# Patient Record
Sex: Male | Born: 1935 | Race: White | Hispanic: No
Health system: Southern US, Community
[De-identification: ages and names within clinical notes are randomized; demographics above are authoritative.]

---

## 2011-08-21 ENCOUNTER — Inpatient Hospital Stay: Payer: Self-pay | Admitting: Internal Medicine

## 2011-08-29 DEATH — deceased

## 2013-06-14 IMAGING — CR DG LUMBAR SPINE 2-3V
1 series · 3 of 3 positions shown · non-contrast
Comparison: none

REASON FOR EXAM: include sacram also. back pain
COMMENTS:

[Series 1: ap · 0.17mm/px · 3 of 3 slices shown]
[im 1/3]
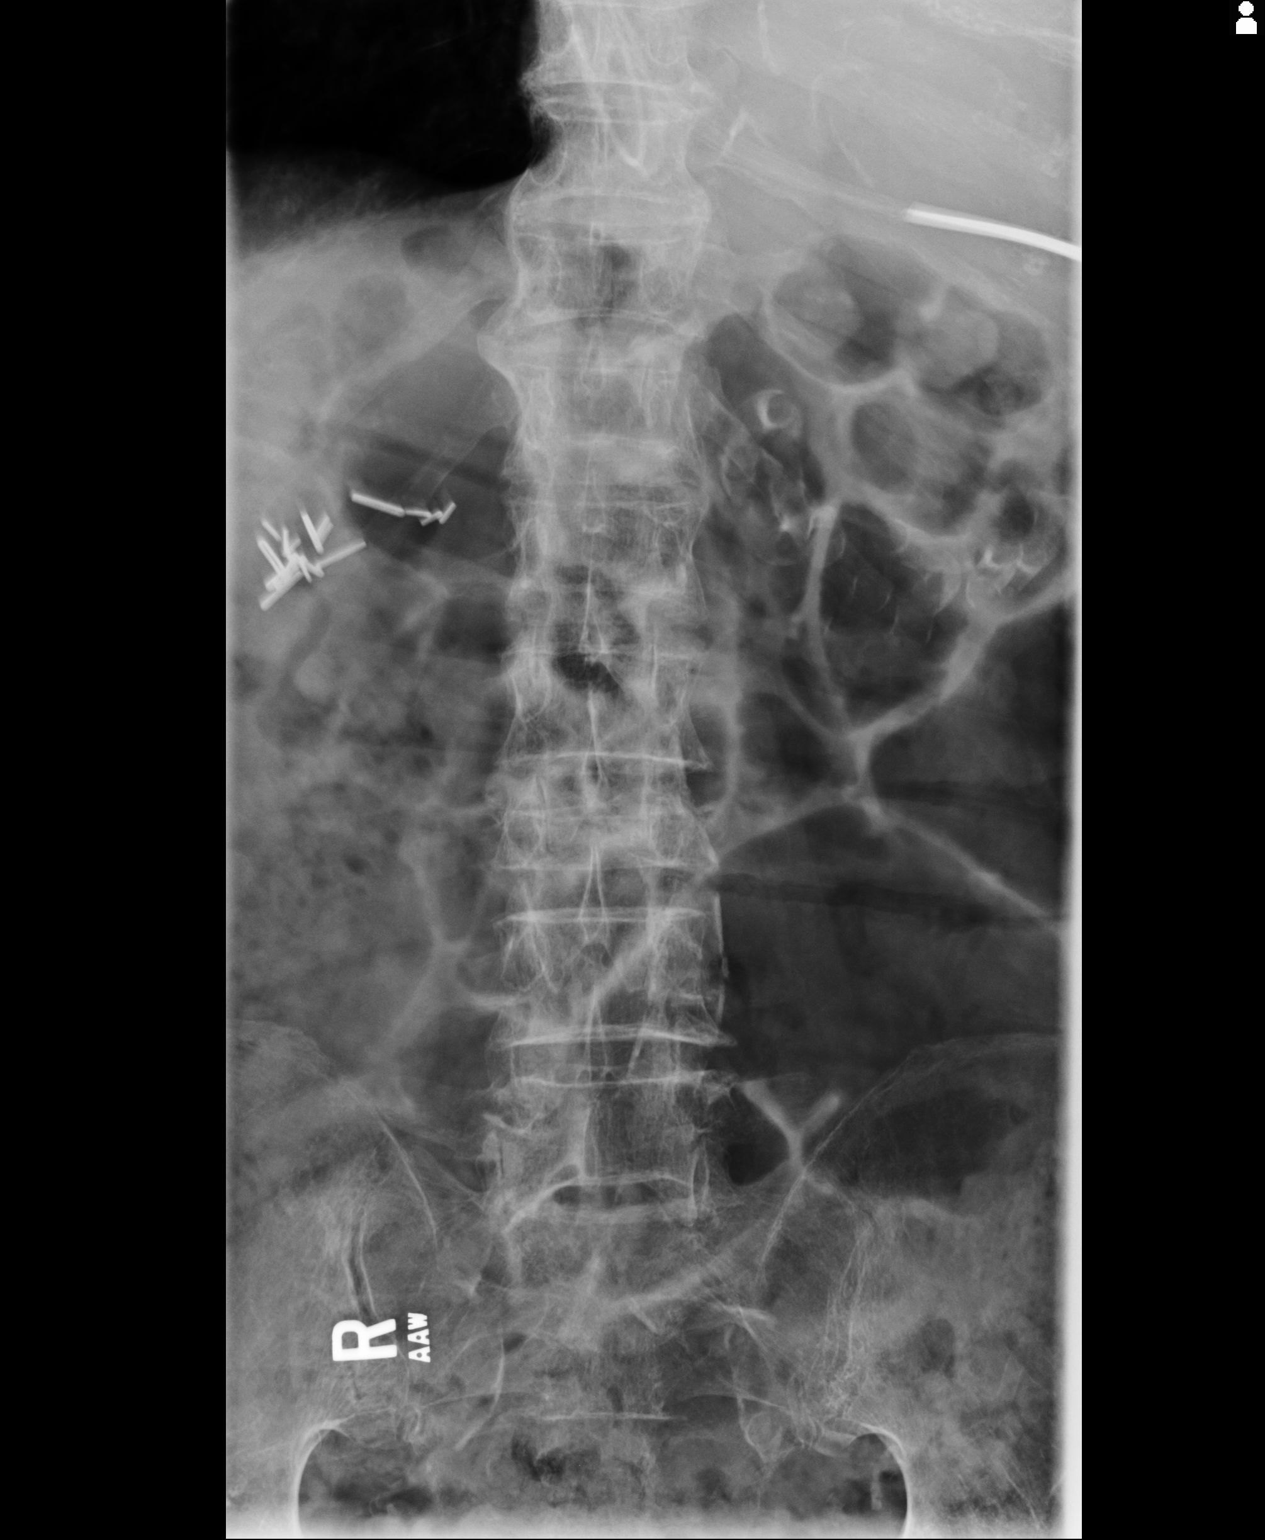
[im 2/3]
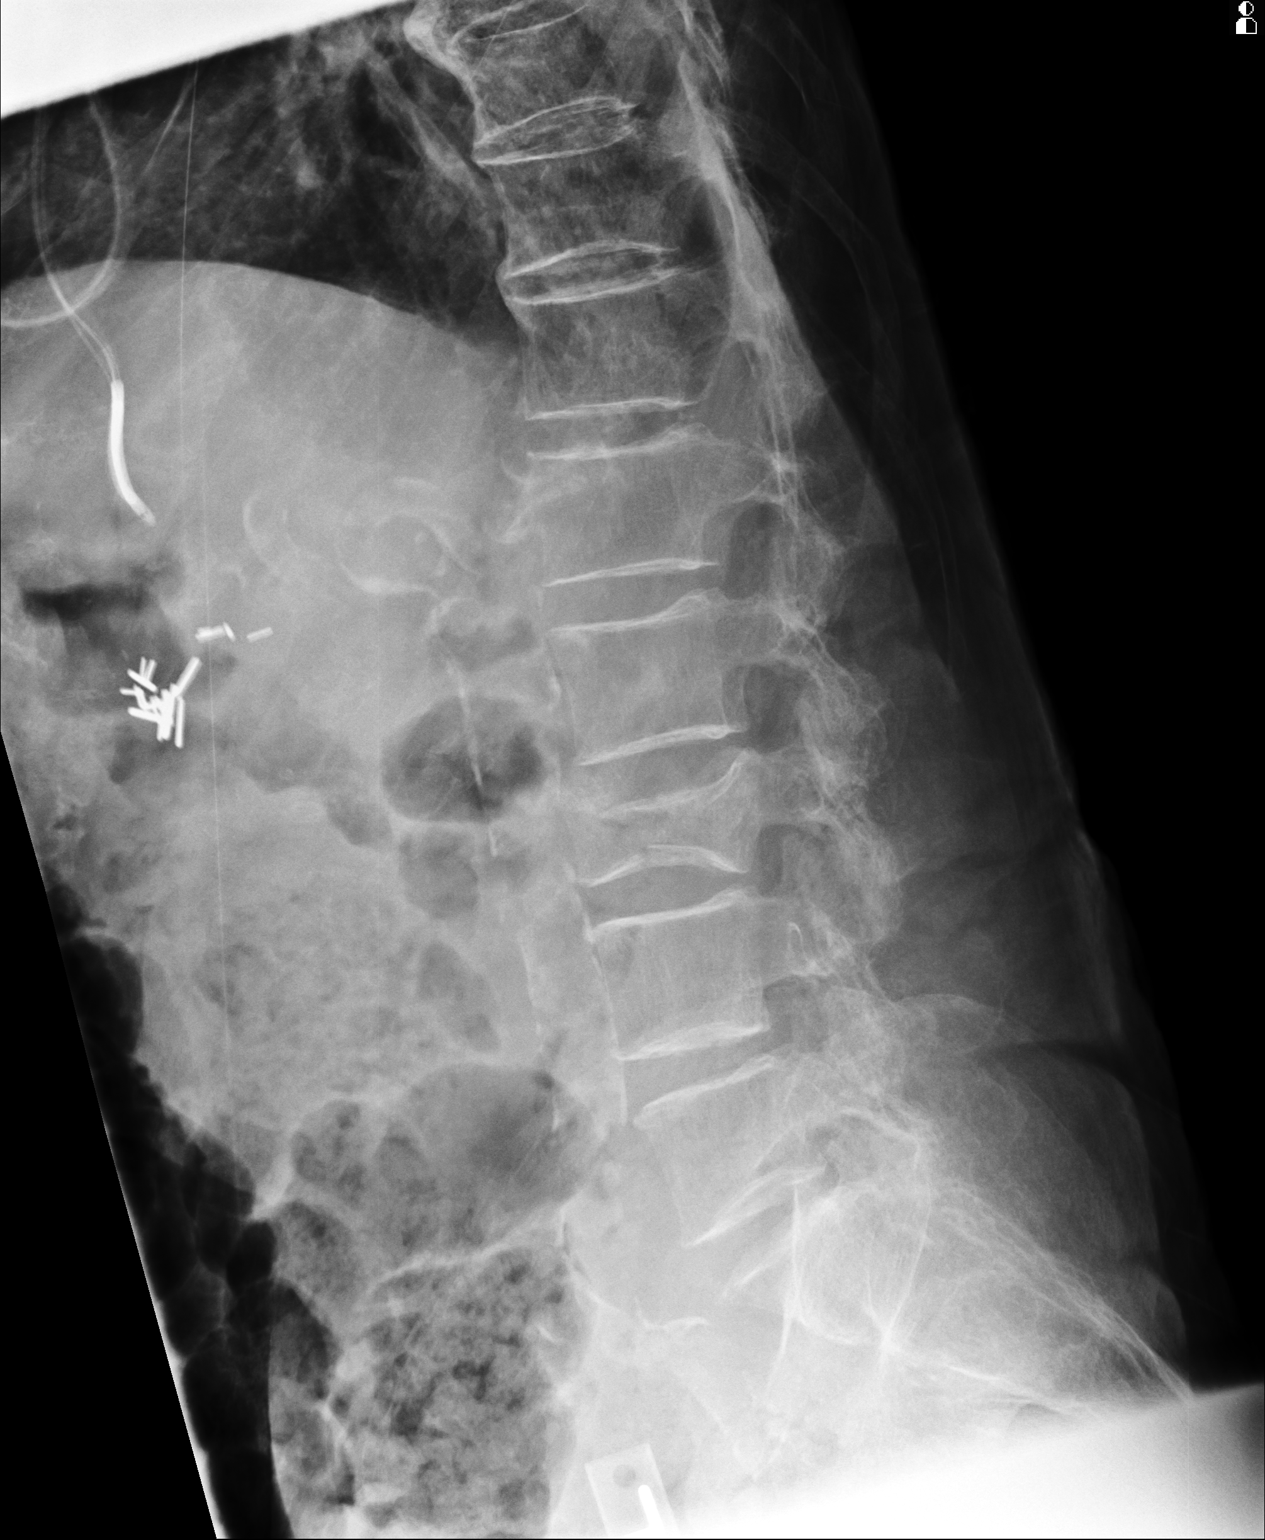
[im 3/3]
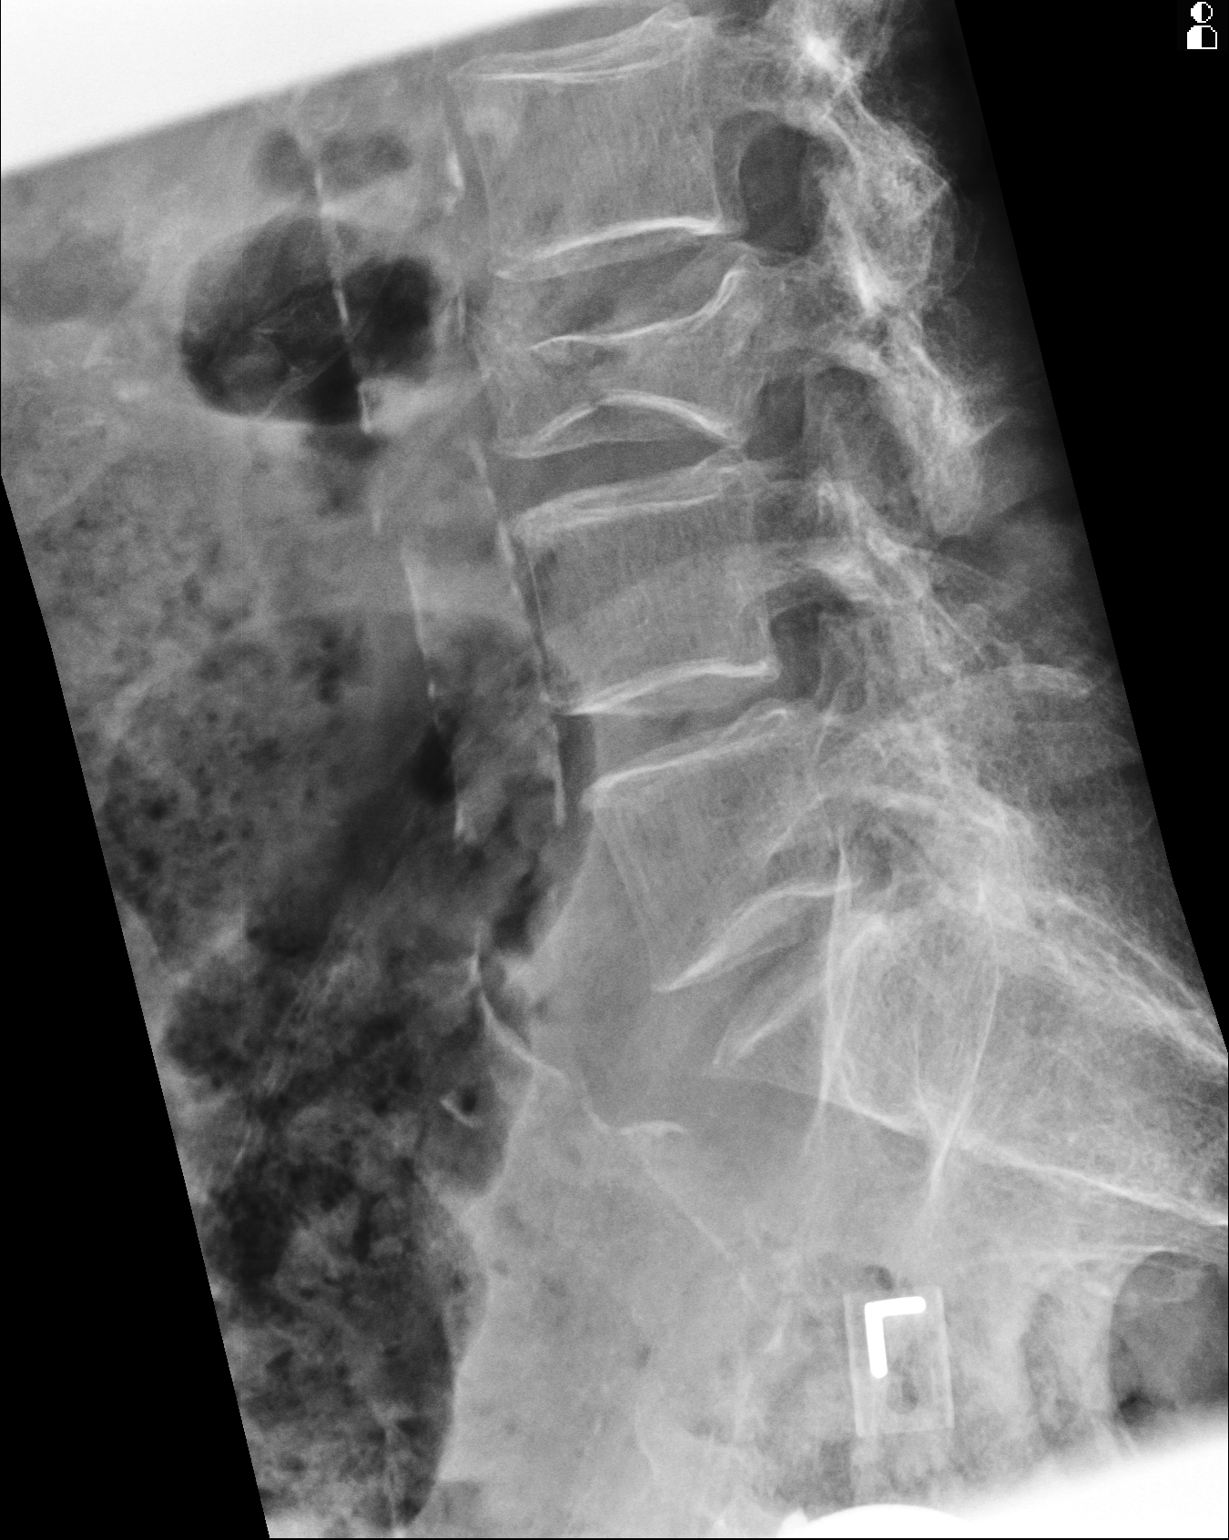

[3 of 3 positions shown; findings below may reference images not displayed]

PROCEDURE:     DXR - DXR LUMBAR SPINE AP AND LATERAL  - August 24, 2011  [DATE]

RESULT:     There is partial compression of the body of L3 with loss of
height of approximately 55 to 60% anteriorly and approximately 15%
posteriorly. I see no significant retropulsion of bone. The other lumbar
levels are preserved in height. There is vascular calcification in the
abdominal aorta and iliac vessels. The observed portions of the sacrum are
grossly intact.
IMPRESSION: The patient has sustained a compression fracture of the
body of L3. Given the patient's symptoms this is presumably acute.

## 2014-12-20 NOTE — H&P (Signed)
PATIENT NAME:  Randy Soto, Randy Soto DATE OF BIRTH:  1921/08/06  DATE OF ADMISSION:  08/21/2011  PRIMARY CARE PHYSICIAN:  Dr. Darrold JunkerParaschos ER PHYSICIAN:  Dr. Buford DresserFinnell  CHIEF COMPLAINT: Trouble breathing.  HISTORY OF PRESENT ILLNESS:  This is an 81 year old male with history of systolic heart failure and dilated cardiomyopathy and history of depression who came [facility] because of altered mental status with trouble breathing and pedal edema. The patient has been residing [facility] for the past three weeks. [place] The patient's niece is giving all history. [facility] and recently noted to have worsening trouble breathing with lower extremity edema and abdominal swelling. The patient was also lethargic. Because of that, the patient was sent here. Also the patient noticed some chest pressure this afternoon along the whole front. Because of trouble breathing, orthopnea, and paroxysmal nocturnal dyspnea he was evaluated in the ER and we are asked to admit for congestive heart failure exacerbation.  The patient right now is on 4 liters, saturating about 92%.  He received 80 mg of Lasix in the ER and started to have some urine output. He says that he is feeling better than when he came in.   PAST MEDICAL HISTORY:  1. History of systolic heart failure. 2. Dilated cardiomyopathy? with [place].  3. History of chronic atrial fibrillation. 4. Depression.  5. He was recently treated for pneumonia with Levaquin and also doxycycline and tapering course of prednisone.  6. He also has glaucoma.   SOCIAL HISTORY: No smoking. No drinking.   PAST SURGICAL HISTORY: 1. Gallbladder surgery.  2. Defibrillator placement.  MEDICATIONS: 1. Coumadin 2.5 mg once a day.  2. Vitamin D 3000 units once a day. 3. Trazodone 50 mg p.o. daily.  4. Tylenol 325 mg 2 tablets every four hours as needed. 5. Vitamin A  and D for skin breakdown. 6. Albuterol nebulizer every four hours as needed.  7. Amiodarone 200 mg p.o. daily. 9. Beano 150 units 1 tablet daily.  10. Biotin mouthwash.   11. Colace 100 mg p.o. daily. 12. Doxycycline 100 mg p.o. b.i.d. End date is 12/30. 13. Famotidine 20 mg p.o. daily. 14. Furosemide 20 mg p.o. daily.  15. Latanoprost 0.005% in both eyes daily.  16. Lexapro 10 mg p.o. daily.  17. Multivitamin 1 tablet p.o. daily.  18. Simvastatin 20 mg p.o. daily.   ALLERGIES: No known allergies.   FAMILY HISTORY: Significant for history of dilated cardiomyopathy. The patient's son died at the age of 79 years because  of idopathic cardiomyopathy and CHF  REVIEW OF SYSTEMS:   Right now he has some weakness. EYES: Has history of glaucoma.  ENT: No tinnitus. No epistaxis. No difficulty swallowing. RESPIRATORY: Has some wheezing. The patient does have some trouble breathing and also dyspnea. CARDIOVASCULAR: Chest pain across the chest and also orthopnea and pedal edema present, and also some palpitations. GASTROINTESTINAL: Has no nausea, no vomiting, has a good appetite, had a good lunch according to the patient's niece at bedside. GU: No dysuria.  INTEGUMENT: No skin rashes. MUSCULOSKELETAL: No joint pain. NEUROLOGIC: No numbness. No weakness. No dysarthria. No tremors. PSYCH: No anxiety or insomnia. Has a history of depression present.      PHYSICAL EXAMINATION:  GENERAL:  Elderly male appearing in mild distress because of trouble breathing on 4 liters of oxygen, saturating around 92%. The patient is initial  sats were around 89% when he came in. The patient is alert and oriented and able to talk, but in mild distress because of trouble breathing.   VITAL SIGNS:  Initial respiratory rate 32, right now 18-20. Blood pressure 112/61 and heart rate is around 102.   HEENT: Atraumatic, normocephalic. Pupils are equally reacting to light. Extraocular movements are intact. ENT: No tympanic  membrane congestion. No turbinate hypertrophy. No oropharyngeal erythema.   NECK: Normal range of motion. No JVD. No carotid bruit.   CARDIOVASCULAR: S1, S2, irregularly irregular.  Has atrial fibrillation. PMI not displaced. Good femoral pulses present. Edema lower extremities present up to knees.    RESPIRATIONS:  The patient had bilateral basilar rales present.  Not using accessory muscles of respiration at this time.   ABDOMEN: Soft, nontender, nondistended. Bowel sounds present. No hepatosplenomegaly.   MUSCULOSKELETAL: Power five out of five in upper and lower extremities.   SKIN: No skin rashes.  LYMPH: No lymphadenopathy.   NEUROLOGICAL: Cranial nerves intact. Deep tendon reflexes 2+ bilaterally. No sensory deficit.   PSYCH:  Oriented to time, place, and person.   LABORATORY, DIAGNOSTIC, AND RADIOLOGICAL DATA: CT of the head showed no acute changes. Chest x-ray showed congestive heart failure. Influenza titers negative. CK total 49, CPK-MB 1.9.   Electrolytes: Sodium 132, potassium 4.1, chloride 97, bicarbonate 25, BUN 35, creatinine 1.48, glucose 120. WBC 9.3, hemoglobin 11.9, hematocrit 35.9, platelets 182. INR 3. Troponin 0.06. The patient's EKG showed atrial fibrillation at 99 beats per minute with T-wave inversions in leads V4, V5, V6. No prior EKGs to compare as he just moved from [place].   ASSESSMENT AND PLAN:  1. The patient is an 81 year old male patient with respiratory distress due to congestive heart failure exacerbation.  The patient has already received 80 mg of Lasix in the ER with good improvement in output and also oxygenation improved. The patient will be admitted to the hospitalist service on telemetry. We will give Lasix 60 IV q.12 hours along with daily weights and also monitor urine output. Continue ACE inhibitor with lisinopril and also beta blocker with Coreg.  The patient's records will be obtained from [place]. We will consult cardiology for congestive  heart failure exacerbation.  2. Mild elevation of troponins probably because of fluid overload and stress on the heart. We will trend troponins and consult cardiology and continue aspirin, beta blockers, and statins along with Coumadin.  3. Chronic atrial fibrillation, rate is overall stable. Continue Coumadin. Today INR is 3 so we will restart Coumadin tomorrow.  4. History of recent pneumonia, finished a course of doxycycline along with nebulizers. Continue the oxygen.  5. Glaucoma. Continue eyedrops.  6. History of depression. He is on Lexapro and trazodone. Continue that.  7. CODE STATUS: DO NOT RESUSCITATE.   Discussed the patient's case with the patient's niece at bedside.   TIME SPENT ON HISTORY AND PHYSICAL: 70 minutes.   ____________________________ Katha Hamming, MD sk:bjt D: 08/21/2011 19:16:44 ET T: 08/22/2011 09:30:21 ET JOB#: [id number]  cc: Katha Hamming, MD, <Dictator> Marcina Millard, MD Katha Hamming MD ELECTRONICALLY SIGNED 09/03/2011 22:10

## 2014-12-20 NOTE — Discharge Summary (Signed)
PATIENT NAME:  Randy Soto, Randy Soto MR#:  [id number] DATE OF BIRTH:  [birth year]  DATE OF ADMISSION:  08/21/2011 DATE OF DISCHARGE:  08/25/2011  [facility]   CODE STATUS: DO NOT RESUSCITATE.   DISCHARGE DIAGNOSES:  1. Acute on chronic systolic heart failure.  2. History of idiopathic dilated cardiomyopathy.  3. History of recent pneumonia.  4. Chronic atrial fibrillation.  5. History of dementia and depression.  6. Recent L3 compression fracture.  DISCHARGE MEDICATIONS:  1. Doxycycline 100 mg p.o. b.i.d. stop date is 12/30. 2. Mucinex 600 mg p.o. b.i.d.  3. Albuterol  inhalers every six hours.  4. Simvastatin 20 mg p.o. daily.  5. Latanoprost eyedrops 0.005% in both eyes.  6. Colace 100 mg p.o. daily.  7. Beano 150 as needed for gas.    9. Trazodone 50 mg daily as needed for sleep.  10. Tramadol 50 mg p.o. t.i.d. as needed for pain.  11. Lexapro 10 mg p.o. daily.  12. Vitamin D3 1000 units p.o. daily.  13. Famotidine 20 mg p.o. daily.  14. Tylenol 325 mg p.o. every four hours as needed.  15. New medicine aspirin 325 mg p.o. daily.  Stop the following: Do not take Coumadin.  The patient's lisinopril, Coreg, and amiodarone are on hold because of hypotension for the past two days. They can be resumed once the blood pressure returns to 120/80. At that time he can be started back on Coreg, a small dose, and also lisinopril and amiodarone as blood pressure improves. Please do not start all the medicines at one time.   OXYGEN: 3 liters via  nasal cannula.    CONSULTATIONS:  1. Cardiology consultation, Dr. Lady GaryFath. 2. Hospice evaluation.   HOSPITAL COURSE:   371. 81 year old male with history of idiopathic dilated cardiomyopathy who [place], [facility], who came in because of worsening trouble breathing with lethargy, orthopnea, and PND. Oxygen sats were around 92% on 4 liters. The patient was admitted to the  hospitalist service for acute on chronic systolic heart failure, started on IV Lasix along with oxygen, continued on his beta blockers, ACE inhibitors. The patient was seen by cardiologist Dr. Lady GaryFath. The patient had a pac[place] last [place] The patient was continued on all the medicines. However, his blood pressure has been low since 12/26, around 98/59 and his blood pressure medications were on hold since yesterday. His Coreg, amiodarone, and also lisinopril are stopped because of hypotension.  The patient's medicines of lisinopril, Coreg, and amiodarone are to be resumed only if the blood pressure improves.  2. The patient also complained of back pain. Lumbar spine x-ray showed compression fracture at  L3. The patient was seen by Dr. Gerrit Heckaliff who recommended physical therapy with no surgical intervention because of his respiratory status. The patient right now has physical therapy that is going to follow him. Also he has tramadol that he can take for pain.  3. Chronic atrial fibrillation. Discussed Coumadin versus aspirin with the patient's family. The patient's family chose aspirin given the risk of falls so we stopped the Coumadin and he is on aspirin 325 mg daily.  4. Patient was also seen by Hospice. The [facility]. The patient's power of attorney, Miss Corrie DandyMary, has requested a hospital bed and oxygen for the patient.  They are in the process of arranging all that today.  Once Hospice is arranged at [facility] the patient will be discharged back to [facility]. I discussed the plan with the patient's POA  Mary. 5. CODE STATUS: DO NOT RESUSCITATE       TOTAL TIME SPENT ON DISCHARGE PREPARATION: More than 30 minutes.    ____________________________ Katha Hamming, MD sk:bjt D: 08/25/2011 11:52:10 ET T: 08/25/2011 12:41:24 ET JOB#: [id number]  cc: Katha Hamming, MD, <Dictator> Katha Hamming  MD ELECTRONICALLY SIGNED 09/03/2011 22:04

## 2014-12-20 NOTE — Consult Note (Signed)
PATIENT NAME:  Rosette RevealLOY, Alto MR#:  [id number] DATE OF BIRTH:  08/21/1921  DATE OF CONSULTATION:  08/25/2011  REFERRING PHYSICIAN:  Dr. Delfino LovettVipul Shah  CONSULTING PHYSICIAN:  Winn JockJames C. Roshonda Sperl, MD  REASON FOR CONSULTATION: 81 year old male with back pain and documented compression fracture on x-ray.   HISTORY OF PRESENT ILLNESS: The patient was admitted to the hospital on 08/21/2011 with systolic heart failure, dilated cardiomyopathy, history of depression with altered mental status after having suffered a fall. Complained of increasing back pain. He was evaluated in the Emergency Room and admitted for his congestive heart failure and respiratory distress. Had chronic atrial fibrillation. Had a recent history of pneumonia. On 12/25 he was being diuresed. He was more awake on 12/26.   On 12/27 he was complaining of back pain. He relates a recent fall. X-rays were ordered which revealed a compression fracture of L3 with no retropulsion.   While in the bed the patient is fairly comfortable, he has been moving around with his compression fracture with mild discomfort.   There is a family member in the room with him.   PAST MEDICAL HISTORY, SOCIAL HISTORY, PAST SURGICAL HISTORY, MEDICATIONS, ALLERGIES, REVIEW OF SYSTEMS: Reviewed as per his admission history and physical and are unchanged.    PHYSICAL EXAMINATION:  GENERAL: Patient is somnolent. Supine in the bed.   CERVICAL AND THORACIC SPINE: Nontender.   LUMBOSACRAL SPINE: Tender at lumbosacral junction.   UPPER EXTREMITIES: Overall good range of motion with mild discomfort.   LOWER EXTREMITIES: Overall good range of motion with mild discomfort.   NEUROLOGICAL: Neurovascular examination is intact.  LABORATORY, DIAGNOSTIC AND RADIOLOGICAL DATA: X-ray of lumbosacral spine showed the compression fracture.   IMPRESSION:  1. Compression fracture L3 with pain. Patient is not a candidate for kyphoplasty because of his medical condition.   2. Significant cardiomyopathy with end-stage disease.   RECOMMENDATIONS: Discussed with the patient and his daughter. May have activity as tolerated. Treat symptomatically.   ____________________________ Winn JockJames C. Gerrit Heckaliff, MD jcc:cms D: 08/25/2011 09:59:41 ET T: 08/25/2011 10:38:42 ET JOB#: [id number]  cc: Winn JockJames C. Gerrit Heckaliff, MD, <Dictator> Winn JockJAMES C Page Lancon MD ELECTRONICALLY SIGNED 09/14/2011 16:03
